# Patient Record
Sex: Male | Born: 1970 | Race: White | Hispanic: No | Marital: Married | Smoking: Never smoker
Health system: Southern US, Community
[De-identification: ages and names within clinical notes are randomized; demographics above are authoritative.]

## PROBLEM LIST (undated history)

## (undated) DIAGNOSIS — K219 Gastro-esophageal reflux disease without esophagitis: Secondary | ICD-10-CM

## (undated) HISTORY — DX: Gastro-esophageal reflux disease without esophagitis: K21.9

## (undated) HISTORY — PX: RENAL ARTERY STENT: SHX2321

## (undated) HISTORY — PX: VASECTOMY: SHX75

---

## 2007-06-25 ENCOUNTER — Emergency Department (HOSPITAL_COMMUNITY): Admission: EM | Admit: 2007-06-25 | Discharge: 2007-06-25 | Payer: Self-pay | Admitting: Emergency Medicine

## 2007-07-27 ENCOUNTER — Ambulatory Visit (HOSPITAL_COMMUNITY): Admission: RE | Admit: 2007-07-27 | Discharge: 2007-07-27 | Payer: Self-pay | Admitting: Urology

## 2007-08-22 IMAGING — CT CT ABDOMEN W/O CM
2 of 4 series · 17 of 46 positions shown, 19 images · non-contrast
Comparison: None.

CLINICAL DATA: Right-sided abdominal pain and right flank pain. Nausea and
vomiting.

CT OF THE ABDOMEN AND PELVIS WITHOUT CONTRAST  06/25/2007:
TECHNIQUE: Multidetector helical CT through the abdomen and pelvis was performed
without intravenous contrast. Oral contrast was not given.

[Series 2: abd/pelv w/o 5.0 b31f st · axial · non-contrast · 0.74mm/px · z∈[-573,-98]mm · 14 of 105 slices shown, 16 images]
[im 5/105  soft-tissue]
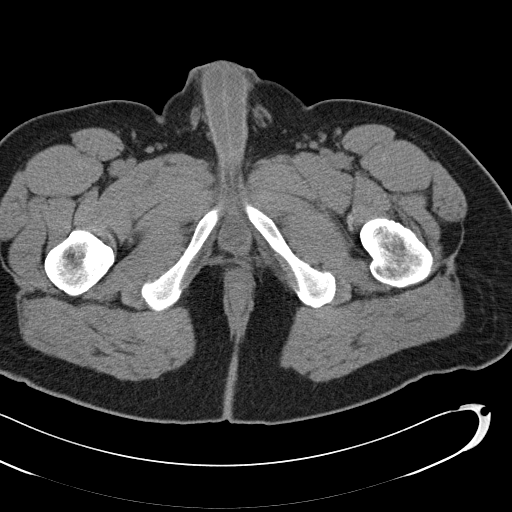
[im 5/105  bone]
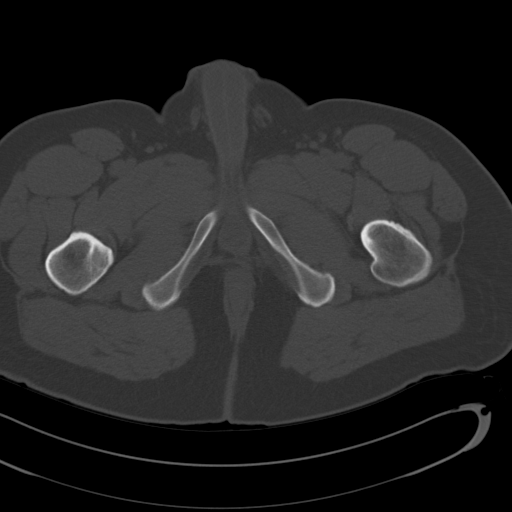
[im 13/105  soft-tissue]
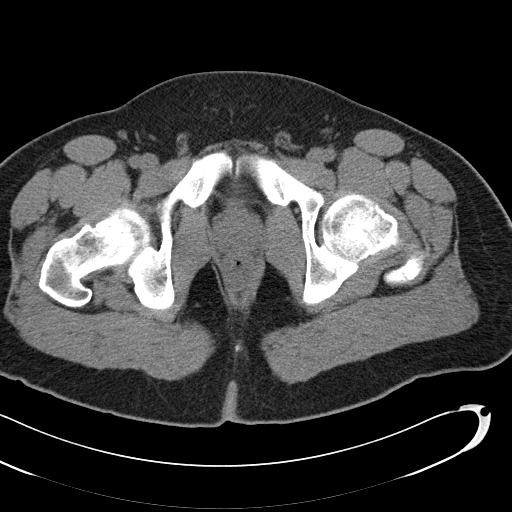
[im 21/105  soft-tissue]
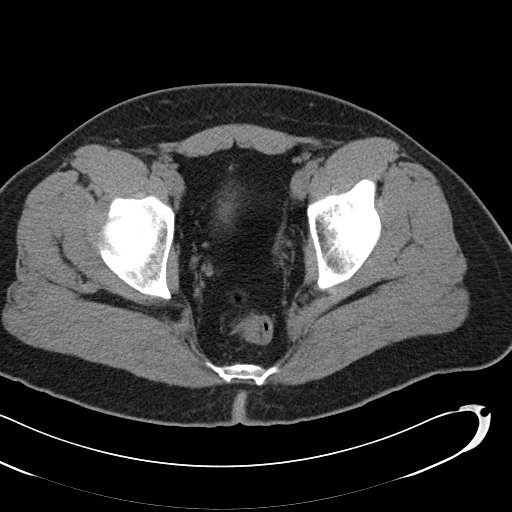
[im 30/105  soft-tissue]
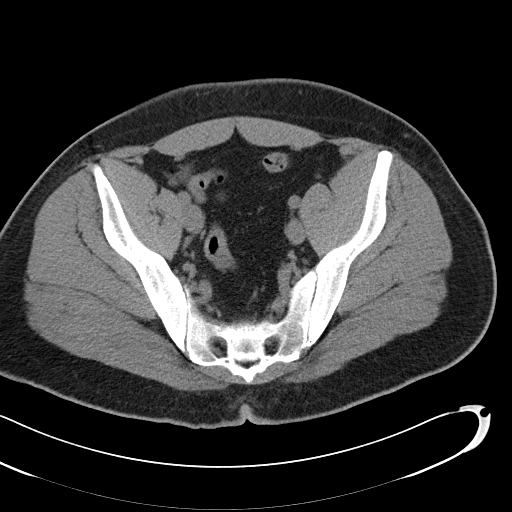
[im 34/105  soft-tissue]
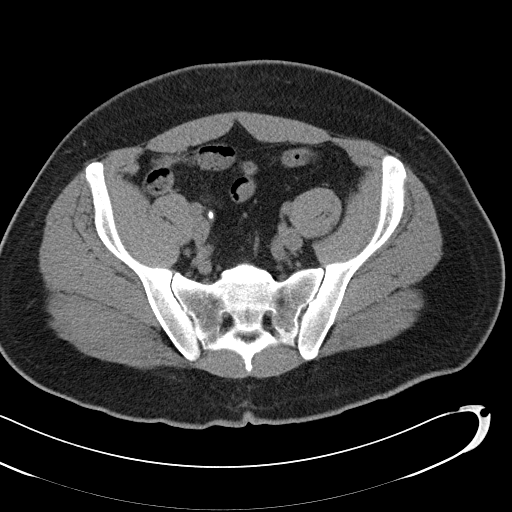
[im 42/105  soft-tissue]
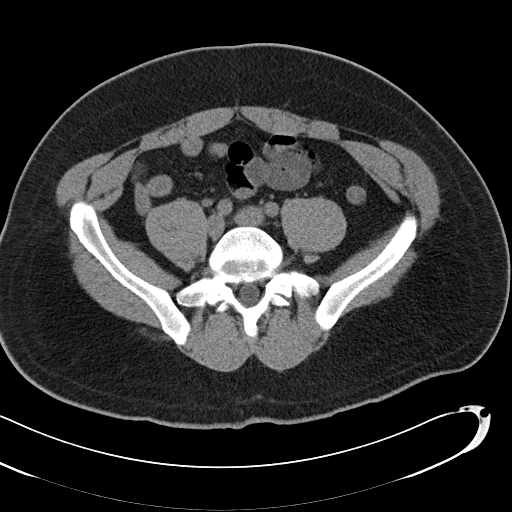
[im 50/105  soft-tissue]
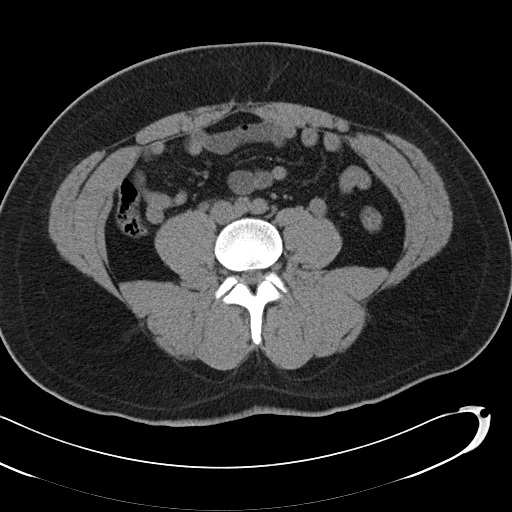
[im 55/105  soft-tissue]
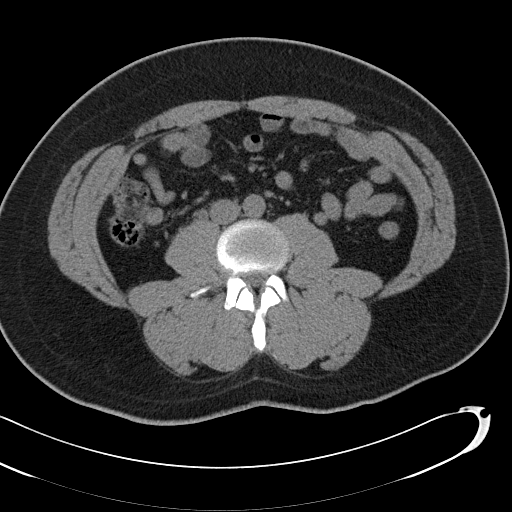
[im 63/105  soft-tissue]
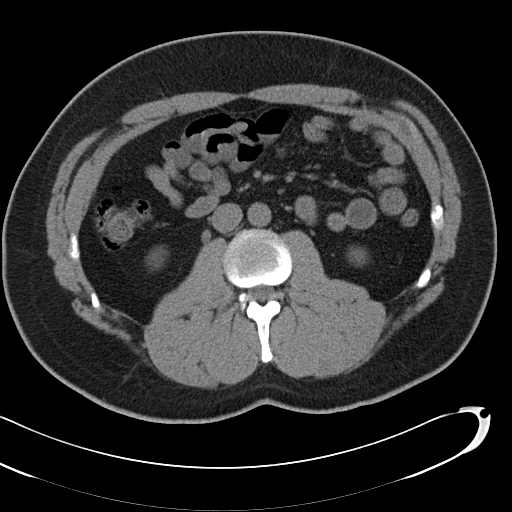
[im 63/105  bone]
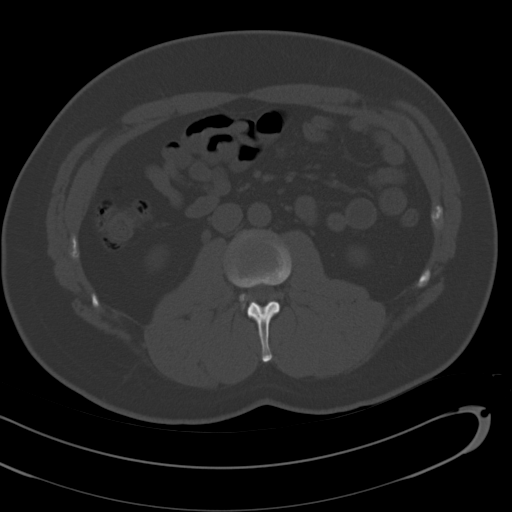
[im 71/105  soft-tissue]
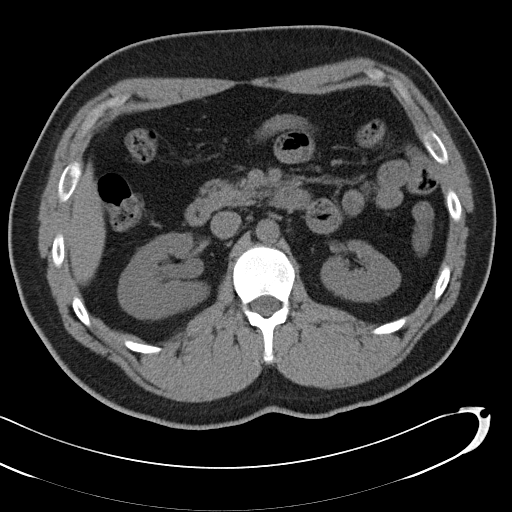
[im 80/105  soft-tissue]
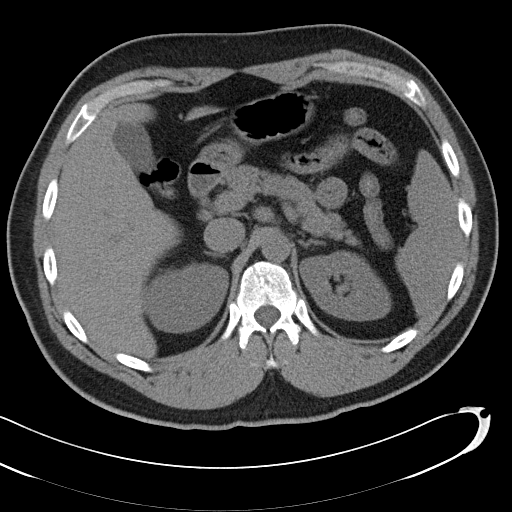
[im 84/105  soft-tissue]
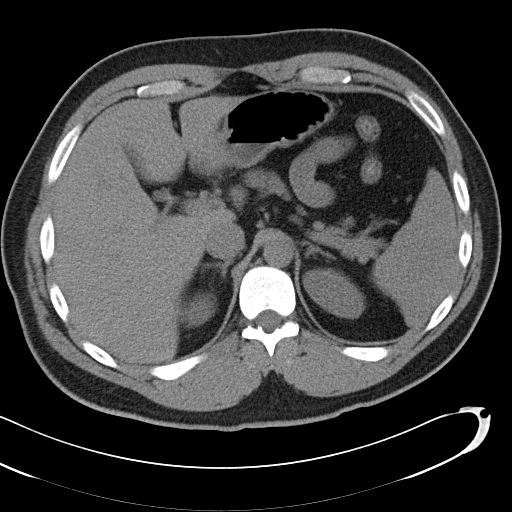
[im 92/105  soft-tissue]
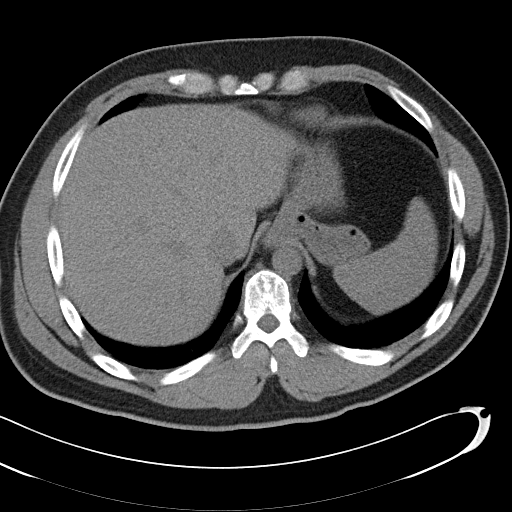
[im 100/105  soft-tissue]
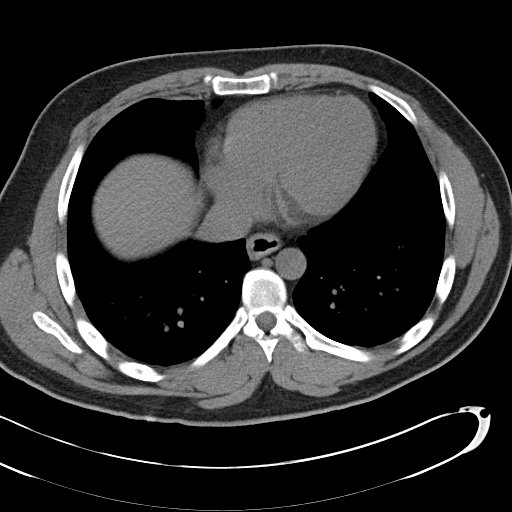

[Series 4: abd/pelv w/o 2.0 spo cor st · coronal · non-contrast · 1.02mm/px · 3 of 137 slices shown]
[im 46/137  soft-tissue]
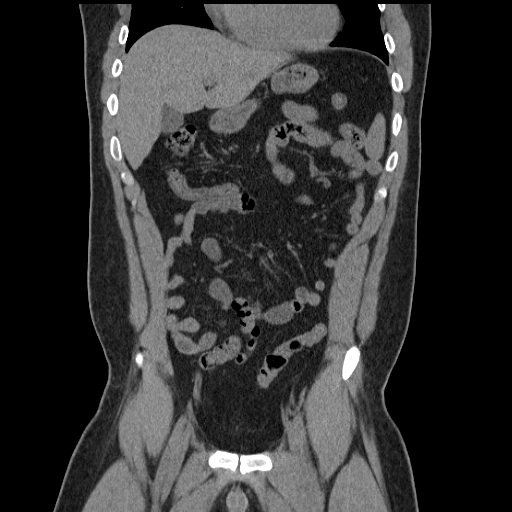
[im 61/137  soft-tissue]
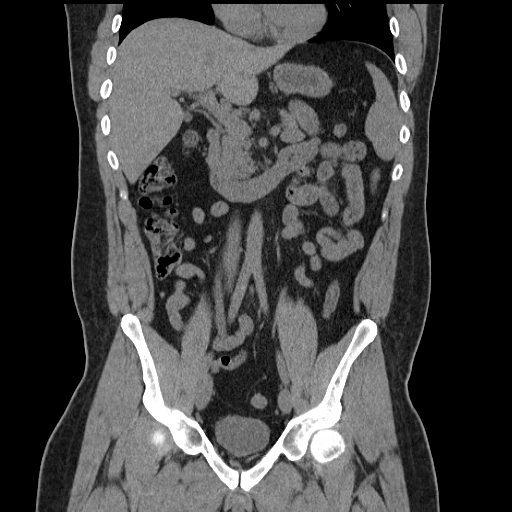
[im 76/137  soft-tissue]
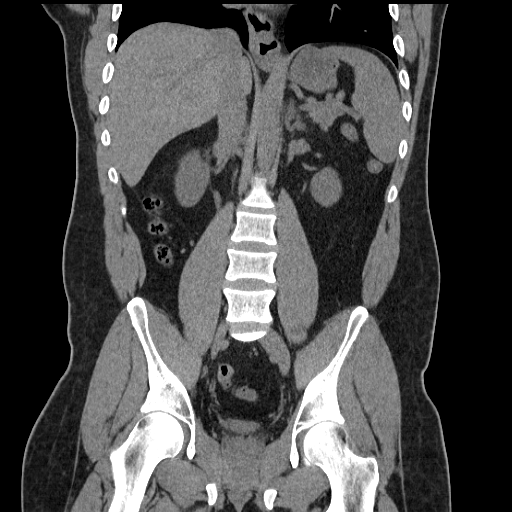

[17 of 46 positions shown; findings below may reference images not displayed]

CT ABDOMEN:

Tiny (1-2 mm) calculus in an upper pole calyx of the right kidney. No left renal
calculi. Moderate right hydronephrosis and dilation of the proximal right
ureter; no proximal right ureteral calculus. Edema involving the right kidney
with mild perinephric edema. Within the limits of the unenhanced technique, no
focal parenchymal abnormalities involving either kidney.

Again within the limits of the unenhanced technique, normal appearing liver,
spleen, pancreas, and adrenal glands. Gallbladder unremarkable by CT. No biliary
ductal dilation. Small hiatal hernia. Visualized colon and small bowel
unremarkable. Abdominal aorta normal in appearance. No significant
lymphadenopathy. No free fluid. Visualized lung bases clear apart from the
expected dependent atelectasis posteriorly in the lower lobes. Bone window
images unremarkable.
IMPRESSION: 1. Right upper urinary tract obstruction. No proximal right ureteral calculus.
2. Tiny (1-2 mm) right upper pole renal calculus.
3. Small hiatal hernia.

CT PELVIS:

Approximately 5 mm right ureteral calculus just below the pelvic brim. No other
ureteral calculi. Normal appearing urinary bladder. Small phlebolith in the left
side of the low pelvis. Normal prostate gland and seminal vesicles. Visualized
colon small bowel unremarkable. Normal appearing appendix in the right upper
pelvis. No free fluid. No significant lymphadenopathy. Bone window images
unremarkable.
IMPRESSION: 1. Obstructing 5 mm mid right ureteral calculus just below the pelvic brim.

## 2007-09-23 IMAGING — CR DG ABDOMEN 1V
2 series · 2 of 2 positions shown · non-contrast
Comparison: Abdominal CT 06/25/07.

CLINICAL DATA: Right ureteral calculus. Pre-lithotripsy.
ABDOMEN - 1 VIEW:

[t abdomen supine (1 of 2)]
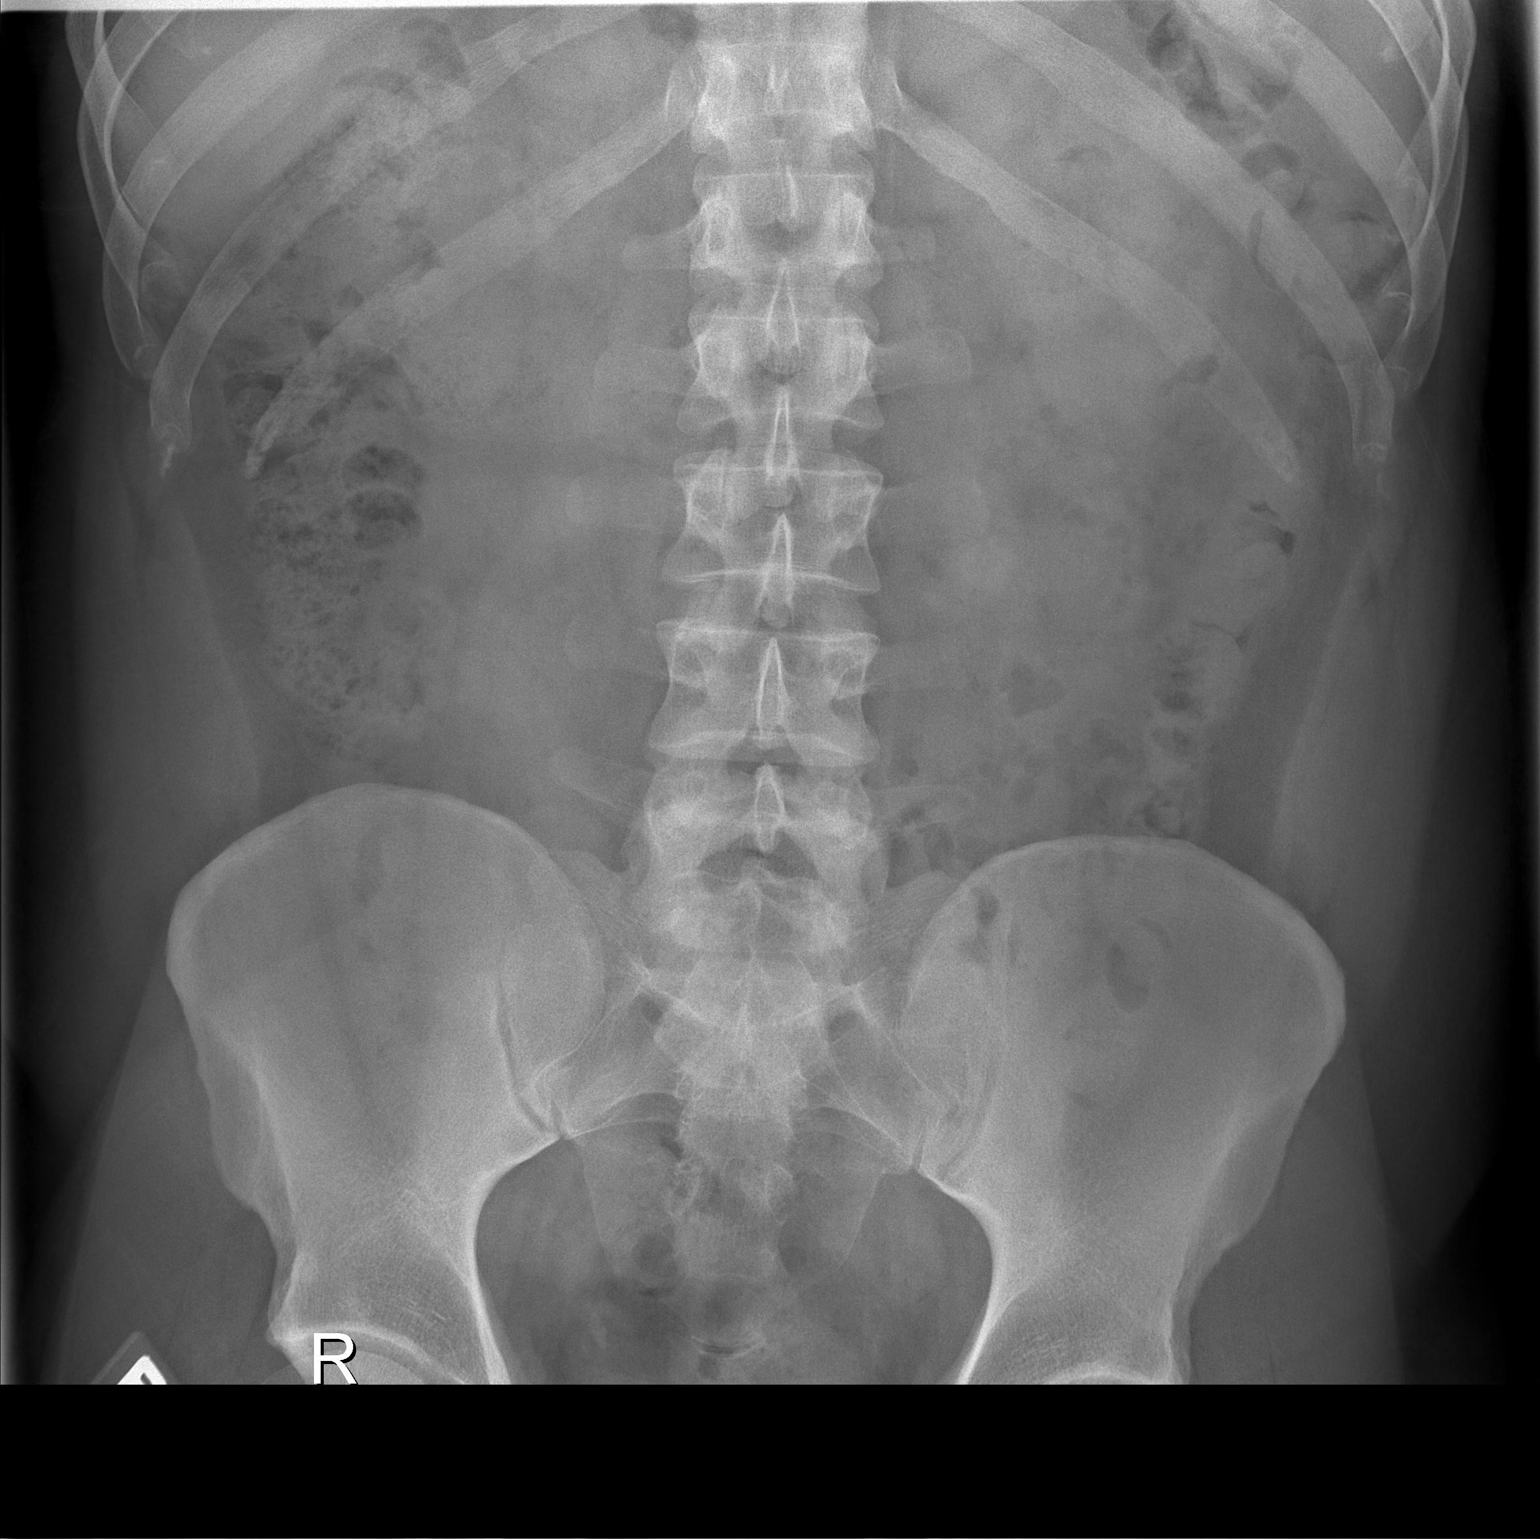

[t abdomen supine (2 of 2)]
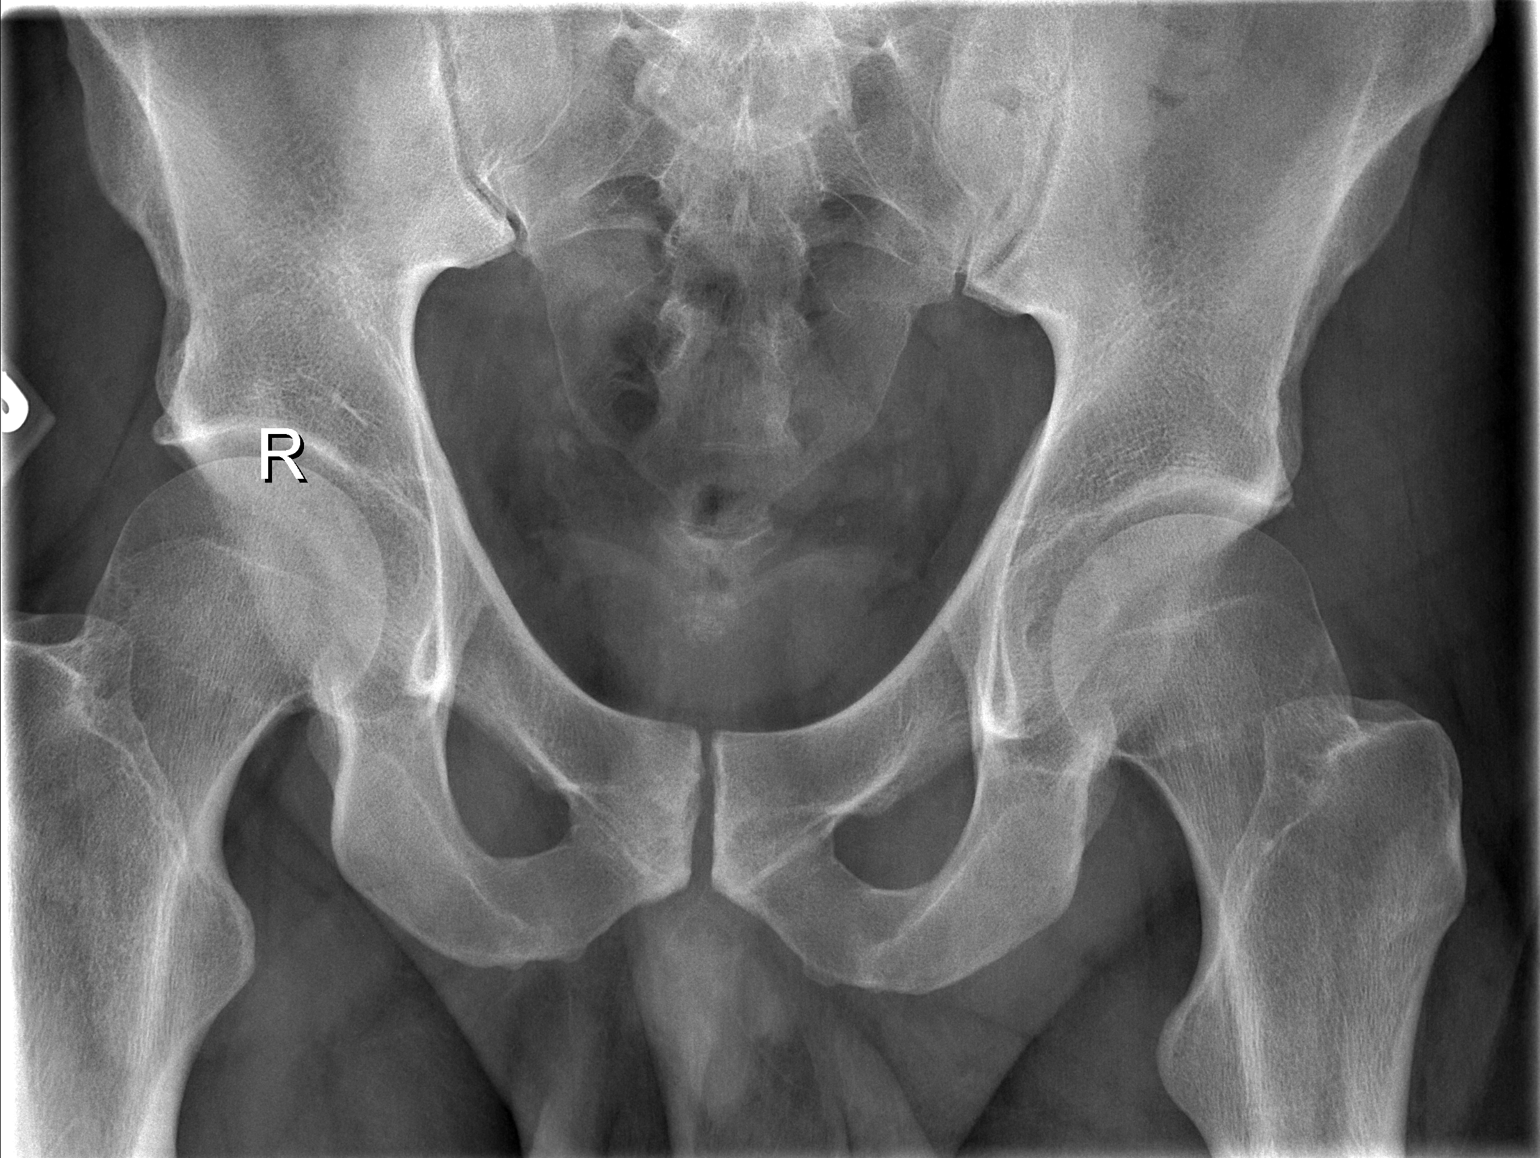

[2 of 2 positions shown; findings below may reference images not displayed]

FINDINGS: The right ureteral calculus overlapped the sacrum on the prior CT.  This calculus is now suspected to be more distally positioned just above the ureterovesical junction. A small left-sided pelvic phlebolith is unchanged. No other calcifications are seen along the expected course of the ureters. The bowel gas pattern appears nonobstructive.
IMPRESSION: Known right ureteral calculus now appears more distally positioned in the pelvis, just proximal to the ureterovesical junction, assuming the patient remains symptomatic.

## 2008-04-08 ENCOUNTER — Emergency Department (HOSPITAL_COMMUNITY): Admission: EM | Admit: 2008-04-08 | Discharge: 2008-04-08 | Payer: Self-pay | Admitting: Emergency Medicine

## 2011-05-13 NOTE — Op Note (Signed)
NAME:  Caleb Patrick, Caleb Patrick NO.:  [id number]   MEDICAL RECORD NO.:  [id number]          PATIENT TYPE:  AMB   LOCATION:  DAY                          FACILITY:  [facility]   PHYSICIAN:  Courtney Paris, M.D.DATE OF BIRTH:  1971/08/15   DATE OF PROCEDURE:  07/27/2007  DATE OF DISCHARGE:                               OPERATIVE REPORT   PREOPERATIVE DIAGNOSIS:  Right distal ureteral stone.   POSTOPERATIVE DIAGNOSIS:  Right distal ureteral stone.   OPERATION:  Cystoscopy, right retrograde pyelogram, ureteroscopy with  holmium laser tripsy and insertion of right ureteral stent.   ANESTHESIA:  General.   SURGEON:  Courtney Paris, M.D.   BRIEF HISTORY:  This 40 year old patient has had nonprogression of the  stone for the last month in the distal right ureter with intermittent  pain.  He elects to come in to have this removed by ureteroscopy at this  time.   The patient was placed on the table in the dorsal lithotomy position  after satisfactory dose of general anesthesia was given IV Cipro  preoperatively.  The 21 panendoscope was inserted.  No anterior  stricture was seen.  Posterior urethra was nonobstructing.  The bladder  was entered.  The bladder carefully inspected.  No bladder mucosal  lesions seen.  The right orifice was slightly small, but I could  catheterize it with a 6 open-ended ureteral catheter and performed an  occlusive retrograde.  This demonstrated a stone where it was seen on  KUB about 2 to 3 cm from the UV junction.   Under fluoroscopy, the guidewire was then passed up the ureter past the  stone up to the level of the kidney as the open-ended catheter was then  removed.  The cystoscope was then removed, and the distal ureter was  then dilated with a ureteral access sheath under direct vision using  fluoroscopy.  Leaving the guidewire in place, a 6 short ureteroscope was  then passed up to the stone where it was photographed.  The stone  seemed  to have a little membrane over it which is probably why it has not moved  in the last month.  For that reason, I did not think a basket would  entrap the stone, and I really could not get the scope past the stone.  For that reason, I brought out the holmium laser tripsy using the small  wire at 0.5 joules of energy.  I was able to under direct vision break  the stone into several small pieces.  I could get the scope past the  stone at that point and then was able to entrap the pieces with a basket  and removed them intact.  Subsequent pass with the scope did not show  any other stone fragments, and the area where the stone was embedded  looked good.  Scope was then removed and over the guidewire passed a 6-  French x 28-cm length double-J ureteral stent with string attached to  the distal.  The guidewire was removed.  There  was a nice coil in the renal pelvis  and one in the bladder.  The string  was brought out the penis and taped to the patient's penis.  He was  given a B&O suppository and a shot of Toradol for postoperative pain  relief.  The anesthetist noticed a mass effect of his neck which will  probably need to be evaluated postoperatively.      Courtney Paris, M.D.  Electronically Signed     HMK/MEDQ  D:  07/27/2007  T:  07/27/2007  Job:  [id number]

## 2011-10-13 LAB — CBC
HCT: 38.8 — ABNORMAL LOW
MCHC: 35.8
MCV: 86.8
RBC: 4.47

## 2011-10-13 LAB — BASIC METABOLIC PANEL
BUN: 8
CO2: 30
Chloride: 103
Creatinine, Ser: 1
GFR calc Af Amer: >60

## 2011-10-13 LAB — URINALYSIS, ROUTINE W REFLEX MICROSCOPIC
Bilirubin Urine: NEGATIVE
Glucose, UA: NEGATIVE
Ketones, ur: NEGATIVE
Protein, ur: NEGATIVE

## 2011-10-15 LAB — COMPREHENSIVE METABOLIC PANEL
Albumin: 4.1
BUN: 10
Calcium: 9.3
Creatinine, Ser: 1.32
Glucose, Bld: 149 — ABNORMAL HIGH
Potassium: 3.7
Total Protein: 7.4

## 2011-10-15 LAB — CBC
HCT: 43.3
MCHC: 34.5
MCV: 88.6
Platelets: 300
RDW: 13.3

## 2011-10-15 LAB — DIFFERENTIAL
Lymphocytes Relative: 13
Lymphs Abs: 1.8
Monocytes Absolute: 0.9 — ABNORMAL HIGH
Monocytes Relative: 6
Neutro Abs: 11.3 — ABNORMAL HIGH
Neutrophils Relative %: 80 — ABNORMAL HIGH

## 2011-10-15 LAB — URINALYSIS, ROUTINE W REFLEX MICROSCOPIC
Glucose, UA: NEGATIVE
Leukocytes, UA: NEGATIVE
Protein, ur: 100 — AB
Specific Gravity, Urine: 1.029
Urobilinogen, UA: 0.2

## 2011-10-15 LAB — URINE MICROSCOPIC-ADD ON

## 2013-08-30 ENCOUNTER — Ambulatory Visit (INDEPENDENT_AMBULATORY_CARE_PROVIDER_SITE_OTHER): Payer: BC Managed Care – PPO | Admitting: Internal Medicine

## 2013-08-30 VITALS — BP 140/82 | HR 73 | Temp 98.5°F | Resp 16 | Ht 75.0 in | Wt 247.4 lb

## 2013-08-30 DIAGNOSIS — H109 Unspecified conjunctivitis: Secondary | ICD-10-CM

## 2013-08-30 DIAGNOSIS — F639 Impulse disorder, unspecified: Secondary | ICD-10-CM

## 2013-08-30 MED ORDER — OFLOXACIN 0.3 % OP SOLN: 1.0000 [drp] | Freq: Four times a day (QID) | OPHTHALMIC | Status: DC

## 2013-08-30 NOTE — Patient Instructions (Addendum)

## 2013-08-30 NOTE — Progress Notes (Signed)
  Subjective:    Patient ID: Caleb Patrick, male    DOB: 1971-10-24, 42 y.o.   MRN: [id number]  Conjunctivitis  The current episode started 2 days ago. The problem occurs rarely. The problem has been gradually worsening. The problem is moderate. Nothing relieves the symptoms. Nothing aggravates the symptoms. Associated symptoms include eye itching, photophobia, eye discharge, eye pain and eye redness. Pertinent negatives include no fever.   Patient comes into office with right eye redness Started Sunday when he went camping Got worse yesterday with the redness swelling and drainage Mild photophobia No vision changes Rhinorrhea positive/no sneezing No fever cough or sore throat  Review of Systems  Constitutional: Negative for fever.  Eyes: Positive for photophobia, pain, discharge, redness and itching.   no rash     Objective:   Physical Exam BP 140/82  Pulse 73  Temp(Src) 98.5 F (36.9 C) (Oral)  Resp 16  Ht 6\' 3"  (1.905 m)  Wt 247 lb 5.8 oz (112.202 kg)  BMI 30.92 kg/m2  SpO2 100% HEENT clear except right at with an injected conjunctiva and injected lids that are slightly swollen Purulent discharge from the inner canthus Pupils equal round reactive to light and accommodation and vision intact Left is not involved No regional adenopathy       Assessment & Plan:  Conjunctivitis  Cold ocular lubricant Meds ordered this encounter  Medications  . ofloxacin (OCUFLOX) 0.3 % ophthalmic solution    Sig: Place 1 drop into the right eye 4 (four) times daily. For 7 days.    Dispense:  5 mL    Refill:  1

## 2014-06-18 ENCOUNTER — Ambulatory Visit (INDEPENDENT_AMBULATORY_CARE_PROVIDER_SITE_OTHER): Payer: BC Managed Care – PPO | Admitting: Family Medicine

## 2014-06-18 VITALS — BP 120/82 | HR 80 | Temp 97.9°F | Resp 14 | Ht 75.0 in | Wt 250.4 lb

## 2014-06-18 DIAGNOSIS — R21 Rash and other nonspecific skin eruption: Secondary | ICD-10-CM

## 2014-06-18 DIAGNOSIS — L237 Allergic contact dermatitis due to plants, except food: Secondary | ICD-10-CM

## 2014-06-18 DIAGNOSIS — L282 Other prurigo: Secondary | ICD-10-CM

## 2014-06-18 DIAGNOSIS — L255 Unspecified contact dermatitis due to plants, except food: Secondary | ICD-10-CM

## 2014-06-18 MED ORDER — PREDNISONE 10 MG PO TABS: ORAL_TABLET | ORAL | Status: DC

## 2014-06-18 NOTE — Patient Instructions (Signed)
       Poison Ivy Poison ivy is a inflammation of the skin (contact dermatitis) caused by touching the allergens on the leaves of the ivy plant following previous exposure to the plant. The rash usually appears 48 hours after exposure. The rash is usually bumps (papules) or blisters (vesicles) in a linear pattern. Depending on your own sensitivity, the rash may simply cause redness and itching, or it may also progress to blisters which may break open. These must be well cared for to prevent secondary bacterial (germ) infection, followed by scarring. Keep any open areas dry, clean, dressed, and covered with an antibacterial ointment if needed. The eyes may also get puffy. The puffiness is worst in the morning and gets better as the day progresses. This dermatitis usually heals without scarring, within 2 to 3 weeks without treatment. HOME CARE INSTRUCTIONS  Thoroughly wash with soap and water as soon as you have been exposed to poison ivy. You have about one half hour to remove the plant resin before it will cause the rash. This washing will destroy the oil or antigen on the skin that is causing, or will cause, the rash. Be sure to wash under your fingernails as any plant resin there will continue to spread the rash. Do not rub skin vigorously when washing affected area. Poison ivy cannot spread if no oil from the plant remains on your body. A rash that has progressed to weeping sores will not spread the rash unless you have not washed thoroughly. It is also important to wash any clothes you have been wearing as these may carry active allergens. The rash will return if you wear the unwashed clothing, even several days later. Avoidance of the plant in the future is the best measure. Poison ivy plant can be recognized by the number of leaves. Generally, poison ivy has three leaves with flowering branches on a single stem. Diphenhydramine may be purchased over the counter and used as needed for itching. Do not  drive with this medication if it makes you drowsy.Ask your caregiver about medication for children. SEEK MEDICAL CARE IF:  Open sores develop.  Redness spreads beyond area of rash.  You notice purulent (pus-like) discharge.  You have increased pain.  Other signs of infection develop (such as fever). Document Released: 12/12/2000 Document Revised: 03/08/2012 Document Reviewed: 10/31/2009 Exit[employer] Patient Information 2015 [employer]. This information is not intended to replace advice given to you by your health care Caleb Patrick. Make sure you discuss any questions you have with your health care Caleb Patrick.  

## 2014-06-18 NOTE — Progress Notes (Signed)
    Chief Complaint:  Chief Complaint  Patient presents with  . Poison Ivy    bil arms, legs    HPI: Caleb Patrick is a 42 y.o. male who is here for Poison oak/ivey exposure with subsequent Bilateral arm rash on legs He states he started havign sxs on Tuesday , on his righ forearm, after turning on water spicket with vine that was near it. Then that area scabbed over but now he is having more areas that are popping up. He ha sone on his right groin, on his left forearm. He denies any pain but does have discomfort, he ahs ahd blisters and warmth, he has had no drainage yet. Has tried benadryl , calamine and over the counter topical sprays. He denies fevers, chills.   History reviewed. No pertinent past medical history. Past Surgical History  Procedure Laterality Date  . Vasectomy     History   Social History  . Marital Status: Married    Spouse Name: N/A    Number of Children: N/A  . Years of Education: N/A   Social History Main Topics  . Smoking status: Never Smoker   . Smokeless tobacco: None  . Alcohol Use: Yes     Comment: social  . Drug Use: No  . Sexual Activity: None   Other Topics Concern  . None   Social History Narrative  . None   History reviewed. No pertinent family history. Allergies  Allergen Reactions  . Ampicillin Anaphylaxis and Hives   Prior to Admission medications   Medication Sig Start Date End Date Taking? Authorizing Provider  omeprazole (PRILOSEC) 20 MG capsule Take 20 mg by mouth daily.   Yes Historical Provider, MD     ROS: The patient denies fevers, chills, night sweats, unintentional weight loss, chest pain, palpitations, wheezing, dyspnea on exertion, nausea, vomiting, abdominal pain, dysuria, hematuria, melena, numbness, weakness, or tingling.   All other systems have been reviewed and were otherwise negative with the exception of those mentioned in the HPI and as above.    PHYSICAL EXAM: Filed Vitals:   06/18/14 1556  BP:  120/82  Pulse: 80  Temp: 97.9 F (36.6 C)  Resp: 14   Filed Vitals:   06/18/14 1556  Height: 6' 3" (1.905 m)  Weight: 250 lb 6.4 oz (113.581 kg)   Body mass index is 31.3 kg/(m^2).  General: Alert, no acute distress HEENT:  Normocephalic, atraumatic, oropharynx patent. EOMI, PERRLA Cardiovascular:  Regular rate and rhythm, no rubs murmurs or gallops.   No pedal edema.  Respiratory: Clear to auscultation bilaterally.  No wheezes, rales, or rhonchi.  No cyanosis, no use of accessory musculature GI: No organomegaly, abdomen is soft and non-tender, positive bowel sounds.  No masses. Skin: + papulomacular rash with blisters that are nondraining, erythematous.  Neurologic: Facial musculature symmetric. Psychiatric: Patient is appropriate throughout our interaction. Musculoskeletal: Gait intact.   LABS: Results for orders placed during the hospital encounter of 07/27/07  BASIC METABOLIC PANEL      Result Value Ref Range   Sodium 139     Potassium 4.0     Chloride 103     CO2 30     Glucose, Bld 104 (*)    BUN 8     Creatinine, Ser 1.00     Calcium 9.6     GFR calc non Af Amer >60     GFR calc Af Amer       Value: >60              The eGFR has been calculated     using the MDRD equation.     This calculation has not been     validated in all clinical  CBC      Result Value Ref Range   WBC 8.4     RBC 4.47     Hemoglobin 13.9     HCT 38.8 (*)    MCV 86.8     MCHC 35.8     RDW 13.4     Platelets 281    URINALYSIS, ROUTINE W REFLEX MICROSCOPIC      Result Value Ref Range   Color, Urine YELLOW     APPearance CLEAR     Specific Gravity, Urine 1.019     pH 6.5     Glucose, UA NEGATIVE     Hgb urine dipstick NEGATIVE     Bilirubin Urine NEGATIVE     Ketones, ur NEGATIVE     Protein, ur NEGATIVE     Urobilinogen, UA 0.2     Nitrite NEGATIVE     Leukocytes, UA       Value: NEGATIVE MICROSCOPIC NOT DONE ON URINES WITH NEGATIVE PROTEIN, BLOOD, LEUKOCYTES, NITRITE, OR  GLUCOSE <1000 mg/dL.     EKG/XRAY:   Primary read interpreted by Dr. Marin Comment at [facility] [address]   ASSESSMENT/PLAN: Encounter Diagnoses  Name Primary?  . Contact dermatitis due to poison ivy Yes  . Rash and nonspecific skin eruption   . Pruritic rash    Try otc hibiclens, c/w benadryl as scheduled every 6-8 hrs, covers with nonadhesive pads and kerlex, he may use calamine prn if needed Rx Prednisone taper If sxs worse and looks liek there is infection ie fevers, chills, redness, warmth then need to call and I will call in doxycycline.  F/u prn  Gross sideeffects, risk and benefits, and alternatives of medications d/w patient. Patient is aware that all medications have potential sideeffects and we are unable to predict every sideeffect or drug-drug interaction that may occur.  LE, [place], DO 06/18/2014 4:32 PM

## 2014-07-02 ENCOUNTER — Ambulatory Visit (INDEPENDENT_AMBULATORY_CARE_PROVIDER_SITE_OTHER): Payer: BC Managed Care – PPO | Admitting: Internal Medicine

## 2014-07-02 VITALS — BP 110/78 | HR 87 | Temp 98.7°F | Resp 24 | Ht 75.0 in | Wt 246.0 lb

## 2014-07-02 DIAGNOSIS — J22 Unspecified acute lower respiratory infection: Secondary | ICD-10-CM

## 2014-07-02 DIAGNOSIS — J988 Other specified respiratory disorders: Secondary | ICD-10-CM

## 2014-07-02 DIAGNOSIS — Z6830 Body mass index (BMI) 30.0-30.9, adult: Secondary | ICD-10-CM

## 2014-07-02 MED ORDER — AZITHROMYCIN 250 MG PO TABS: ORAL_TABLET | ORAL | Status: DC

## 2014-07-02 NOTE — Progress Notes (Signed)
   Subjective:    Patient ID: Caleb Solomonsndrew J Fishel, male    DOB: 1971-02-20, 43 y.o.   MRN: [id number]  HPI This chart was scribed for Ellamae Siaobert Harvis Mabus, MD by Phillis HaggisGabriella Gaje, ED Scribe. This patient was seen in room 9 and the patient's care was started at 10:40 AM.  HPI Comments: Caleb Patrick is a 43 y.o. male who presents to the [facility] complaining of cough onset 2 days ago Reports sore throat, cough, SOB Denies drainage, trouble swallowing, change in appetite, trouble sleeping, and fever Wife ill like this 2 weeks on antibio   There are no active problems to display for this patient.  No past medical history on file. Past Surgical History  Procedure Laterality Date  . Vasectomy     Allergies  Allergen Reactions  . Ampicillin Anaphylaxis and Hives   Prior to Admission medications   Medication Sig Start Date End Date Taking? Authorizing Provider  omeprazole (PRILOSEC) 20 MG capsule Take 20 mg by mouth daily.   Yes Historical Provider, MD   History   Social History  . Marital Status: Married    Spouse Name: N/A    Number of Children: N/A  . Years of Education: N/A   Occupational History  . Not on file.   Social History Main Topics  . Smoking status: Never Smoker   . Smokeless tobacco: Never Used  . Alcohol Use: Yes     Comment: social  . Drug Use: No  . Sexual Activity: Not on file   Other Topics Concern  . Not on file   Social History Narrative  . No narrative on file      Review of Systems  HENT: Positive for sore throat.   Respiratory: Positive for cough, shortness of breath and wheezing.        Objective:   Physical Exam  HENT:  Nose: Nose normal.  Mildly inflamed throat without exudate  Eyes: Conjunctivae are normal.  Cardiovascular: Normal rate and regular rhythm.   No murmur heard. Pulmonary/Chest: No respiratory distress. He has no wheezes. He has no rales. He exhibits no tenderness.  Chest is clear to auscultation except  bibasilar rhonchi that clear w/ cough  Lymphadenopathy:    He has no cervical adenopathy.          Assessment & Plan:  Lower respiratory infection  ?mycoplasma BMI 30.0-30.9,adult  Meds ordered this encounter  Medications  . azithromycin (ZITHROMAX) 250 MG tablet    Sig: As packaged    Dispense:  6 tablet    Refill:  0  otc cough meds  I have completed the patient encounter in its entirety as documented by the scribe, with editing by me where necessary. Betina Puckett P. Merla Richesoolittle, M.D.

## 2023-12-16 ENCOUNTER — Ambulatory Visit: Payer: Self-pay | Admitting: Family

## 2023-12-29 ENCOUNTER — Other Ambulatory Visit: Payer: Self-pay | Admitting: Family

## 2023-12-29 ENCOUNTER — Ambulatory Visit: Payer: BC Managed Care – PPO | Admitting: Family

## 2023-12-29 ENCOUNTER — Encounter: Payer: Self-pay | Admitting: Family

## 2023-12-29 VITALS — BP 132/98 | HR 84 | Temp 98.1°F | Ht 75.0 in | Wt 257.2 lb

## 2023-12-29 DIAGNOSIS — R7989 Other specified abnormal findings of blood chemistry: Secondary | ICD-10-CM

## 2023-12-29 DIAGNOSIS — Z13220 Encounter for screening for lipoid disorders: Secondary | ICD-10-CM

## 2023-12-29 DIAGNOSIS — Z1211 Encounter for screening for malignant neoplasm of colon: Secondary | ICD-10-CM

## 2023-12-29 DIAGNOSIS — R030 Elevated blood-pressure reading, without diagnosis of hypertension: Secondary | ICD-10-CM | POA: Diagnosis not present

## 2023-12-29 DIAGNOSIS — Z0000 Encounter for general adult medical examination without abnormal findings: Secondary | ICD-10-CM | POA: Diagnosis not present

## 2023-12-29 DIAGNOSIS — Z6832 Body mass index (BMI) 32.0-32.9, adult: Secondary | ICD-10-CM | POA: Diagnosis not present

## 2023-12-29 DIAGNOSIS — R6882 Decreased libido: Secondary | ICD-10-CM | POA: Diagnosis not present

## 2023-12-29 LAB — COMPREHENSIVE METABOLIC PANEL
ALT: 17 U/L (ref 0–53)
AST: 14 U/L (ref 0–37)
Albumin: 4.4 g/dL (ref 3.5–5.2)
Alkaline Phosphatase: 72 U/L (ref 39–117)
BUN: 12 mg/dL (ref 6–23)
CO2: 29 meq/L (ref 19–32)
Calcium: 9.7 mg/dL (ref 8.4–10.5)
Chloride: 102 meq/L (ref 96–112)
Creatinine, Ser: 1.03 mg/dL (ref 0.40–1.50)
GFR: 84.78 mL/min (ref >60.00)
Glucose, Bld: 101 mg/dL — ABNORMAL HIGH (ref 70–99)
Potassium: 4.4 meq/L (ref 3.5–5.1)
Sodium: 139 meq/L (ref 135–145)
Total Bilirubin: 0.7 mg/dL (ref 0.2–1.2)
Total Protein: 7.3 g/dL (ref 6.0–8.3)

## 2023-12-29 LAB — CBC
HCT: 46.1 % (ref 39.0–52.0)
Hemoglobin: 15.6 g/dL (ref 13.0–17.0)
MCHC: 33.8 g/dL (ref 30.0–36.0)
MCV: 91 fL (ref 78.0–100.0)
Platelets: 317 10*3/uL (ref 150.0–400.0)
RBC: 5.06 Mil/uL (ref 4.22–5.81)
RDW: 14.1 % (ref 11.5–15.5)
WBC: 9.6 10*3/uL (ref 4.0–10.5)

## 2023-12-29 LAB — TESTOSTERONE: Testosterone: 77.94 ng/dL — ABNORMAL LOW (ref 300.00–890.00)

## 2023-12-29 LAB — LIPID PANEL
Cholesterol: 195 mg/dL (ref 0–200)
HDL: 46 mg/dL (ref >39.00)
LDL Cholesterol: 124 mg/dL — ABNORMAL HIGH (ref 0–99)
NonHDL: 148.88
Total CHOL/HDL Ratio: 4
Triglycerides: 125 mg/dL (ref 0.0–149.0)
VLDL: 25 mg/dL (ref 0.0–40.0)

## 2023-12-29 LAB — TSH: TSH: 2.06 u[IU]/mL (ref 0.35–5.50)

## 2023-12-29 NOTE — Assessment & Plan Note (Signed)
Testosterone today, as in am. Will pend results.

## 2023-12-29 NOTE — Assessment & Plan Note (Signed)
 Pt advised of the following:  Continue medication as prescribed. Monitor blood pressure periodically and/or when you feel symptomatic. Goal is <130/90 on average. Ensure that you have rested for 30 minutes prior to checking your blood pressure. Record your readings and bring them to your next visit if necessary.work on a low sodium diet.

## 2023-12-29 NOTE — Assessment & Plan Note (Signed)
 Pt advised to work on diet and exercise as tolerated

## 2023-12-29 NOTE — Progress Notes (Signed)
 Testosterone  is very low. I do suggest we repeat this again in the am to verify, but this is lower than normal and can contribute to fatigue and loss of libido.   Schedule a LAB ONLY where you come in office just to get labs but has to be in the morning for a true read.   Cholesterol is slightly elevated goal LDL is less than 70 and you are at 124. First I would have you try to work on low cholesterol diet and exercising when you are able. We can always repeat at our next follow to see if there is improvement before we talk about needing any medication intervention.

## 2023-12-29 NOTE — Progress Notes (Signed)
 New Patient Office Visit  Subjective:  Patient ID: Caleb Patrick, male    DOB: [birth year]  Age: 52 y.o. MRN: [id number]  CC:  Chief Complaint  Patient presents with   Establish Care    HPI Caleb Patrick is here to establish care as a new patient and to have an annual exam.   Oriented to practice routines and expectations.  Prior provider was: has not been for years   Pt is with acute concerns.  Low libido, wants to have testosterone  checked. Denies ED.  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  General diet.   Colonoscopy, never had but open to this.  Declines tdap and shingles vaccination.       chronic concerns:  Heartburn: omeprazole only as needed. States mainly when he eats something that aggravates GERD.    ROS: Negative unless specifically indicated above in HPI.   Current Outpatient Medications:    omeprazole (PRILOSEC) 20 MG capsule, Take 20 mg by mouth daily as needed., Disp: , Rfl:  Past Medical History:  Diagnosis Date   GERD (gastroesophageal reflux disease)    Past Surgical History:  Procedure Laterality Date   RENAL ARTERY STENT     VASECTOMY      Objective:   Today's Vitals: BP (!) 132/98 (BP Location: Right Arm, Patient Position: Sitting, Cuff Size: Large)   Pulse 84   Temp 98.1 F (36.7 C) (Temporal)   Ht 6' 3 (1.905 m)   Wt 257 lb 3.2 oz (116.7 kg)   SpO2 96%   BMI 32.15 kg/m   Physical Exam Vitals reviewed.  Constitutional:      General: He is not in acute distress.    Appearance: Normal appearance. He is normal weight. He is not ill-appearing, toxic-appearing or diaphoretic.  HENT:     Head: Normocephalic.     Right Ear: Tympanic membrane normal.     Left Ear: Tympanic membrane normal.     Nose: Nose normal.     Mouth/Throat:     Mouth: Mucous membranes are moist.  Eyes:      Pupils: Pupils are equal, round, and reactive to light.  Cardiovascular:     Rate and Rhythm: Normal rate and regular rhythm.  Pulmonary:     Effort: Pulmonary effort is normal.     Breath sounds: Normal breath sounds.  Abdominal:     General: Abdomen is flat.     Tenderness: There is no abdominal tenderness.  Musculoskeletal:        General: Normal range of motion.  Skin:    General: Skin is warm.  Neurological:     General: No focal deficit present.     Mental Status: He is alert and oriented to person, place, and time. Mental status is at baseline.  Psychiatric:        Mood and Affect: Mood normal.        Behavior: Behavior normal.        Thought Content: Thought content normal.        Judgment: Judgment normal.     Assessment & Plan:  BMI 32.0-32.9,adult Assessment & Plan: Pt advised to work on diet and exercise as tolerated    Low libido Assessment & Plan: Testosterone  today, as in am. Will pend results.   Orders: -     Testosterone   Screening for colon cancer -     Ambulatory referral to Gastroenterology  Encounter for general adult medical examination without abnormal findings Assessment & Plan: Patient Counseling(The following topics were reviewed):  Preventative care handout given to pt  Health maintenance and immunizations reviewed. Please refer to Health maintenance section. Pt advised on safe sex, wearing seatbelts in car, and proper nutrition labwork ordered today for annual Dental health: Discussed importance of regular tooth brushing, flossing, and dental visits.   Orders: -     Lipid panel -     CBC -     Comprehensive metabolic panel -     TSH  Screening for lipoid disorders -     Lipid panel  Elevated blood pressure reading in office without diagnosis of hypertension Assessment & Plan: Pt advised of the following:  Continue medication as prescribed. Monitor blood pressure periodically and/or when you feel symptomatic. Goal is <130/90 on  average. Ensure that you have rested for 30 minutes prior to checking your blood pressure. Record your readings and bring them to your next visit if necessary.work on a low sodium diet.   Orders: -     TSH    Follow-up: Return in about 1 year (around 12/28/2024) for f/u CPE.   Caleb Patrick, FNP

## 2023-12-29 NOTE — Patient Instructions (Signed)
  A referral was placed today for colonoscopy Please let us know if you have not heard back within 2 weeks about the referral.

## 2023-12-29 NOTE — Assessment & Plan Note (Signed)

## 2023-12-31 ENCOUNTER — Encounter: Payer: Self-pay | Admitting: *Deleted

## 2024-01-03 DIAGNOSIS — S61432A Puncture wound without foreign body of left hand, initial encounter: Secondary | ICD-10-CM | POA: Diagnosis not present

## 2024-01-03 DIAGNOSIS — W540XXA Bitten by dog, initial encounter: Secondary | ICD-10-CM | POA: Diagnosis not present

## 2024-01-03 DIAGNOSIS — S61431A Puncture wound without foreign body of right hand, initial encounter: Secondary | ICD-10-CM | POA: Diagnosis not present

## 2024-01-18 ENCOUNTER — Encounter: Payer: Self-pay | Admitting: *Deleted

## 2024-01-27 ENCOUNTER — Other Ambulatory Visit (INDEPENDENT_AMBULATORY_CARE_PROVIDER_SITE_OTHER): Payer: BC Managed Care – PPO

## 2024-01-27 ENCOUNTER — Other Ambulatory Visit: Payer: Self-pay | Admitting: Family

## 2024-01-27 ENCOUNTER — Encounter: Payer: Self-pay | Admitting: Family

## 2024-01-27 DIAGNOSIS — R7989 Other specified abnormal findings of blood chemistry: Secondary | ICD-10-CM

## 2024-01-27 DIAGNOSIS — R6882 Decreased libido: Secondary | ICD-10-CM

## 2024-01-27 LAB — TESTOSTERONE: Testosterone: 193.5 ng/dL — ABNORMAL LOW (ref 300.00–890.00)

## 2024-03-18 ENCOUNTER — Encounter: Payer: Self-pay | Admitting: Urology

## 2024-03-18 ENCOUNTER — Ambulatory Visit: Payer: BC Managed Care – PPO | Admitting: Urology

## 2024-03-18 VITALS — BP 145/98 | HR 69 | Ht 75.0 in | Wt 260.0 lb

## 2024-03-18 DIAGNOSIS — R6882 Decreased libido: Secondary | ICD-10-CM | POA: Diagnosis not present

## 2024-03-18 DIAGNOSIS — E291 Testicular hypofunction: Secondary | ICD-10-CM | POA: Diagnosis not present

## 2024-03-18 NOTE — Progress Notes (Signed)
 I,Caleb Patrick,acting as a scribe for Riki Altes, MD.,have documented all relevant documentation on the behalf of Riki Altes, MD,as directed by  Riki Altes, MD while in the presence of Riki Altes, MD.  03/18/2024 9:54 AM   Caleb Patrick 09-Nov-1971 [id number]  Referring provider: Mort Sawyers, FNP [address] [place],  [place] [address]  Chief Complaint  Patient presents with   Other    HPI: Caleb Patrick is a 53 y.o. male referred for a evaluation of hypogonadism.   1 year of history of tiredness, fatigue, and decreased libido. Symptoms are described as severe. A recent PCP visit he requested he have his testosterone level checked. Level checked December 2024 was 78 ng/mL, and a repeat level January 2025 was 194 ng/mL.  No previous history of low testosterone diagnosis  No prior urologic history.   PMH: Past Medical History:  Diagnosis Date   GERD (gastroesophageal reflux disease)     Surgical History: Past Surgical History:  Procedure Laterality Date   RENAL ARTERY STENT     VASECTOMY      Home Medications:  Allergies as of 03/18/2024       Reactions   Ampicillin Anaphylaxis, Hives        Medication List        Accurate as of March 18, 2024  9:54 AM. If you have any questions, ask your nurse or doctor.          omeprazole 20 MG capsule Commonly known as: PRILOSEC Take 20 mg by mouth daily as needed.        Allergies:  Allergies  Allergen Reactions   Ampicillin Anaphylaxis and Hives    Family History: Family History  Problem Relation Age of Onset   Breast cancer Mother    Healthy Half-Sister    Healthy Half-Brother     Social History:  reports that he has never smoked. He has never used smokeless tobacco. He reports current alcohol use. He reports that he does not use drugs.   Physical Exam: BP (!) 145/98   Pulse 69   Ht 6\' 3"  (1.905 m)   Wt 260 lb (117.9 kg)   BMI 32.50 kg/m   Constitutional:   Alert and oriented, No acute distress. HEENT: [place] AT Respiratory: Normal respiratory effort, no increased work of breathing. GU: Phallus without lesion. Testes descended bilaterally without masses or tenderness. Testes volume approximately 20 cc bilaterally.  Psychiatric: Normal mood and affect.   Assessment & Plan:    1. Hypogonadism Severe symptoms of tiredness, fatigue, and decreased libido.  We discussed the diagnosis of testosterone is based on 2 abnormal total testosterone levels drawn in the a.m. admitted with signs and symptoms of low testosterone. We discussed various forms of testosterone placement including topical preparations, intramuscular injections, subcutaneous injections, subcutaneous pellet implantation and oral testosterone.  Pros and cons of each form were discussed.  The risk of transference of topical testosterone. I had an extensive discussion regarding testosterone replacement therapy including the following: Treatment may result in improvements in erectile function, low sex drive, anemia, bone mineral density, lean body mass, and depressive symptoms; evidence is inconclusive whether testosterone therapy improves cognitive function, measures of diabetes, energy, fatigue, lipid profiles, and quality of life measures; there is no conclusive evidence linking testosterone therapy to the development of prostate cancer; there is no definitive evidence linking testosterone therapy to a higher incidence of venothrombolic events; at the present time  it cannot be stated definitively whether testosterone therapy increases or decreases the risk of cardiovascular events including myocardial infarction and stroke. Potential side effects were discussed including erythrocytosis, gynecomastia.  The need for regular monitoring of testosterone levels and hematocrit was discussed. LH level drawn to rule out hypogonadotrophic hypogonadism; no prior PSA on chart review and baseline PSA ordered.  If  LH level is not abnormal, he would initially prefer a topical gel.  I have reviewed the above documentation for accuracy and completeness, and I agree with the above.   Riki Altes, MD  [name] [address] [place], [place] [address] [phone]

## 2024-03-19 LAB — PSA: Prostate Specific Ag, Serum: 0.5 ng/mL (ref 0.0–4.0)

## 2024-03-19 LAB — LUTEINIZING HORMONE: LH: 4 m[IU]/mL (ref 1.7–8.6)

## 2024-03-23 ENCOUNTER — Other Ambulatory Visit: Payer: Self-pay | Admitting: Urology

## 2024-03-23 ENCOUNTER — Encounter: Payer: Self-pay | Admitting: Urology

## 2024-03-23 MED ORDER — TESTOSTERONE 20.25 MG/ACT (1.62%) TD GEL: TRANSDERMAL | 1 refills | Status: DC

## 2024-03-24 ENCOUNTER — Encounter: Payer: Self-pay | Admitting: *Deleted

## 2024-04-21 ENCOUNTER — Other Ambulatory Visit: Payer: Self-pay | Admitting: Urology

## 2024-04-21 NOTE — Telephone Encounter (Signed)
 Notified patient as instructed,.

## 2024-04-26 ENCOUNTER — Other Ambulatory Visit: Payer: Self-pay | Admitting: Urology

## 2024-04-26 MED ORDER — TLANDO 112.5 MG PO CAPS: 2.0000 | ORAL_CAPSULE | Freq: Two times a day (BID) | ORAL | 0 refills | Status: DC

## 2024-05-11 ENCOUNTER — Other Ambulatory Visit

## 2024-05-18 ENCOUNTER — Other Ambulatory Visit: Payer: Self-pay

## 2024-05-18 DIAGNOSIS — R6882 Decreased libido: Secondary | ICD-10-CM

## 2024-05-18 DIAGNOSIS — E291 Testicular hypofunction: Secondary | ICD-10-CM

## 2024-05-19 ENCOUNTER — Other Ambulatory Visit

## 2024-05-19 DIAGNOSIS — E291 Testicular hypofunction: Secondary | ICD-10-CM

## 2024-05-19 DIAGNOSIS — R6882 Decreased libido: Secondary | ICD-10-CM

## 2024-05-20 LAB — TESTOSTERONE: Testosterone: 240 ng/dL — ABNORMAL LOW (ref 264–916)

## 2024-05-22 ENCOUNTER — Ambulatory Visit: Payer: Self-pay | Admitting: Urology

## 2024-05-25 LAB — LUTEINIZING HORMONE: LH: 0.7 m[IU]/mL — ABNORMAL LOW (ref 1.7–8.6)

## 2024-05-25 LAB — SPECIMEN STATUS REPORT

## 2024-06-01 ENCOUNTER — Other Ambulatory Visit: Payer: Self-pay | Admitting: Urology

## 2024-06-08 ENCOUNTER — Encounter: Payer: Self-pay | Admitting: Urology

## 2024-06-16 ENCOUNTER — Encounter: Payer: Self-pay | Admitting: Family

## 2024-06-16 ENCOUNTER — Ambulatory Visit: Admitting: Family

## 2024-06-16 ENCOUNTER — Ambulatory Visit: Payer: Self-pay | Admitting: Family

## 2024-06-16 VITALS — BP 140/98 | HR 88 | Temp 98.2°F | Resp 16 | Ht 75.0 in | Wt 264.0 lb

## 2024-06-16 DIAGNOSIS — I10 Essential (primary) hypertension: Secondary | ICD-10-CM | POA: Diagnosis not present

## 2024-06-16 DIAGNOSIS — Z6832 Body mass index (BMI) 32.0-32.9, adult: Secondary | ICD-10-CM

## 2024-06-16 DIAGNOSIS — E7800 Pure hypercholesterolemia, unspecified: Secondary | ICD-10-CM | POA: Diagnosis not present

## 2024-06-16 MED ORDER — LOSARTAN POTASSIUM 50 MG PO TABS: 50.0000 mg | ORAL_TABLET | Freq: Every day | ORAL | 0 refills | Status: DC

## 2024-06-16 NOTE — Progress Notes (Signed)
 Established Patient Office Visit  Subjective:   Patient ID: Caleb Patrick, male    DOB: Aug 28, 1971  Age: 53 y.o. MRN: [id number]  CC:  Chief Complaint  Patient presents with   Hypertension    HPI: [name] is a 53 y.o. male presenting on 06/16/2024 for Hypertension  HTN: has checked a few times at home, average seems to be higher 130's/90 on average. Last visit with urologist was 145/98 in march. No blurry vision. No headache. No chest pain palpitations and or sob.   Low testosterone : seen regularly by urologist Dr. Cherlynn Cornfield.   HLD: LDL last in December was 124.   Pt does state that he was taking clomid as a trial with Dr. Cherlynn Cornfield but when he was lying down it made him more sob and he couldn't breathe on his back, of which he states has improved since stopping clomid.         ROS: Negative unless specifically indicated above in HPI.   Relevant past medical history reviewed and updated as indicated.   Allergies and medications reviewed and updated.   Current Outpatient Medications:    ASHWAGANDHA PO, Take by mouth., Disp: , Rfl:    losartan (COZAAR) 50 MG tablet, Take 1 tablet (50 mg total) by mouth daily., Disp: 90 tablet, Rfl: 0   Misc Natural Products (SUPER GREENS PO), Take by mouth., Disp: , Rfl:    omeprazole (PRILOSEC) 20 MG capsule, Take 20 mg by mouth daily as needed., Disp: , Rfl:   Allergies  Allergen Reactions   Ampicillin Anaphylaxis and Hives    Objective:   BP (!) 140/98   Pulse 88   Temp 98.2 F (36.8 C)   Resp 16   Ht 6' 3 (1.905 m)   Wt 264 lb (119.7 kg)   SpO2 98%   BMI 33.00 kg/m    Physical Exam Vitals reviewed.  Constitutional:      General: He is not in acute distress.    Appearance: Normal appearance. He is normal weight. He is not ill-appearing, toxic-appearing or diaphoretic.   Cardiovascular:     Rate and Rhythm: Normal rate and regular rhythm.  Pulmonary:     Effort: Pulmonary effort is normal.     Breath sounds:  Normal breath sounds.   Musculoskeletal:        General: Normal range of motion.     Right lower leg: No edema.     Left lower leg: No edema.   Neurological:     General: No focal deficit present.     Mental Status: He is alert and oriented to person, place, and time. Mental status is at baseline.   Psychiatric:        Mood and Affect: Mood normal.        Behavior: Behavior normal.        Thought Content: Thought content normal.        Judgment: Judgment normal.     Assessment & Plan:  BMI 32.0-32.9,adult  Elevated LDL cholesterol level Assessment & Plan: Pt advised to:  work on low cholesterol diet and exercise as tolerated      Primary hypertension Assessment & Plan: Start losartan  Will obtain urine m/a next visit  Pt advised of the following:  Continue medication as prescribed. Monitor blood pressure periodically and/or when you feel symptomatic. Goal is <130/90 on average. Ensure that you have rested for 30 minutes prior to checking your blood pressure. Record your readings and bring  them to your next visit if necessary.work on a low sodium diet.   Orders: -     Losartan Potassium; Take 1 tablet (50 mg total) by mouth daily.  Dispense: 90 tablet; Refill: 0 -     EKG 12-Lead     Follow up plan: Return in about 3 months (around 09/16/2024) for f/u blood pressure.  Felicita Horns, FNP

## 2024-06-16 NOTE — Assessment & Plan Note (Signed)
 Start losartan  Will obtain urine m/a next visit  Pt advised of the following:  Continue medication as prescribed. Monitor blood pressure periodically and/or when you feel symptomatic. Goal is <130/90 on average. Ensure that you have rested for 30 minutes prior to checking your blood pressure. Record your readings and bring them to your next visit if necessary.work on a low sodium diet.

## 2024-06-16 NOTE — Assessment & Plan Note (Signed)
Pt advised to:  work on low cholesterol diet and exercise as tolerated

## 2024-08-25 ENCOUNTER — Other Ambulatory Visit: Payer: Self-pay | Admitting: Physician Assistant

## 2024-08-25 ENCOUNTER — Ambulatory Visit
Admission: RE | Admit: 2024-08-25 | Discharge: 2024-08-25 | Disposition: A | Discharge status: Home / Self Care | Payer: Self-pay | Source: Ambulatory Visit | Attending: Physician Assistant | Admitting: Physician Assistant

## 2024-08-25 DIAGNOSIS — Z0289 Encounter for other administrative examinations: Secondary | ICD-10-CM

## 2024-08-31 ENCOUNTER — Encounter: Payer: Self-pay | Admitting: Family

## 2024-08-31 DIAGNOSIS — I10 Essential (primary) hypertension: Secondary | ICD-10-CM

## 2024-09-01 MED ORDER — LOSARTAN POTASSIUM 50 MG PO TABS: 50.0000 mg | ORAL_TABLET | Freq: Every day | ORAL | 0 refills | Status: DC

## 2024-12-30 ENCOUNTER — Encounter: Payer: Self-pay | Admitting: Family

## 2024-12-30 DIAGNOSIS — I10 Essential (primary) hypertension: Secondary | ICD-10-CM

## 2024-12-30 MED ORDER — LOSARTAN POTASSIUM 50 MG PO TABS: 50.0000 mg | ORAL_TABLET | Freq: Every day | ORAL | 0 refills | Status: AC

## 2025-01-16 ENCOUNTER — Ambulatory Visit: Admitting: Family
